# Patient Record
Sex: Male | Born: 1960 | Race: White | Hispanic: No | Smoking: Current every day smoker
Health system: Southern US, Community
[De-identification: ages and names within clinical notes are randomized; demographics above are authoritative.]

## PROBLEM LIST (undated history)

## (undated) DIAGNOSIS — M5442 Lumbago with sciatica, left side: Secondary | ICD-10-CM

## (undated) DIAGNOSIS — I10 Essential (primary) hypertension: Secondary | ICD-10-CM

## (undated) DIAGNOSIS — E782 Mixed hyperlipidemia: Secondary | ICD-10-CM

## (undated) DIAGNOSIS — B1920 Unspecified viral hepatitis C without hepatic coma: Secondary | ICD-10-CM

## (undated) DIAGNOSIS — J449 Chronic obstructive pulmonary disease, unspecified: Secondary | ICD-10-CM

## (undated) DIAGNOSIS — G8929 Other chronic pain: Secondary | ICD-10-CM

## (undated) HISTORY — DX: Essential (primary) hypertension: I10

## (undated) HISTORY — DX: Mixed hyperlipidemia: E78.2

## (undated) HISTORY — DX: Lumbago with sciatica, left side: M54.42

## (undated) HISTORY — DX: Chronic obstructive pulmonary disease, unspecified: J44.9

## (undated) HISTORY — PX: OTHER SURGICAL HISTORY: SHX169

## (undated) HISTORY — DX: Unspecified viral hepatitis C without hepatic coma: B19.20

## (undated) HISTORY — DX: Other chronic pain: G89.29

---

## 2002-12-31 ENCOUNTER — Emergency Department (HOSPITAL_COMMUNITY): Admission: EM | Admit: 2002-12-31 | Discharge: 2002-12-31 | Payer: Self-pay | Admitting: Emergency Medicine

## 2008-08-29 ENCOUNTER — Observation Stay (HOSPITAL_COMMUNITY): Admission: EM | Admit: 2008-08-29 | Discharge: 2008-08-30 | Payer: Self-pay | Admitting: Emergency Medicine

## 2008-11-11 ENCOUNTER — Emergency Department (HOSPITAL_COMMUNITY): Admission: EM | Admit: 2008-11-11 | Discharge: 2008-11-11 | Payer: Self-pay | Admitting: Emergency Medicine

## 2008-11-14 ENCOUNTER — Emergency Department (HOSPITAL_COMMUNITY): Admission: EM | Admit: 2008-11-14 | Discharge: 2008-11-14 | Payer: Self-pay | Admitting: Emergency Medicine

## 2008-11-17 ENCOUNTER — Emergency Department (HOSPITAL_COMMUNITY): Admission: EM | Admit: 2008-11-17 | Discharge: 2008-11-17 | Payer: Self-pay | Admitting: Emergency Medicine

## 2009-04-06 ENCOUNTER — Emergency Department (HOSPITAL_COMMUNITY): Admission: EM | Admit: 2009-04-06 | Discharge: 2009-04-06 | Payer: Self-pay | Admitting: Emergency Medicine

## 2009-08-10 ENCOUNTER — Emergency Department (HOSPITAL_COMMUNITY): Admission: EM | Admit: 2009-08-10 | Discharge: 2009-08-10 | Payer: Self-pay | Admitting: Emergency Medicine

## 2009-09-26 ENCOUNTER — Emergency Department (HOSPITAL_COMMUNITY): Admission: EM | Admit: 2009-09-26 | Discharge: 2009-09-26 | Payer: Self-pay | Admitting: Emergency Medicine

## 2009-10-25 ENCOUNTER — Emergency Department (HOSPITAL_COMMUNITY): Admission: EM | Admit: 2009-10-25 | Discharge: 2009-10-25 | Payer: Self-pay | Admitting: Emergency Medicine

## 2009-11-24 ENCOUNTER — Ambulatory Visit: Payer: Self-pay | Admitting: Family Medicine

## 2009-11-24 ENCOUNTER — Ambulatory Visit (HOSPITAL_COMMUNITY): Admission: RE | Admit: 2009-11-24 | Discharge: 2009-11-24 | Payer: Self-pay | Admitting: Family Medicine

## 2009-11-24 DIAGNOSIS — M129 Arthropathy, unspecified: Secondary | ICD-10-CM | POA: Insufficient documentation

## 2009-11-29 LAB — CONVERTED CEMR LAB
ALT: 35 units/L (ref 0–53)
AST: 33 units/L (ref 0–37)
CO2: 25 meq/L (ref 19–32)
Calcium: 9.4 mg/dL (ref 8.4–10.5)
Glucose, Bld: 91 mg/dL (ref 70–99)
HDL: 51 mg/dL (ref >39)
Potassium: 4.7 meq/L (ref 3.5–5.3)
Sodium: 141 meq/L (ref 135–145)
Triglycerides: 120 mg/dL (ref <150)
Uric Acid, Serum: 4.6 mg/dL (ref 4.0–7.8)

## 2009-12-18 ENCOUNTER — Encounter (INDEPENDENT_AMBULATORY_CARE_PROVIDER_SITE_OTHER): Payer: Self-pay | Admitting: *Deleted

## 2009-12-18 DIAGNOSIS — F17200 Nicotine dependence, unspecified, uncomplicated: Secondary | ICD-10-CM

## 2010-01-22 ENCOUNTER — Telehealth: Payer: Self-pay | Admitting: *Deleted

## 2010-01-22 ENCOUNTER — Telehealth: Payer: Self-pay | Admitting: Family Medicine

## 2010-01-24 ENCOUNTER — Ambulatory Visit: Payer: Self-pay | Admitting: Family Medicine

## 2010-01-24 ENCOUNTER — Encounter: Payer: Self-pay | Admitting: Family Medicine

## 2010-01-24 DIAGNOSIS — M25569 Pain in unspecified knee: Secondary | ICD-10-CM

## 2010-02-08 ENCOUNTER — Encounter: Payer: Self-pay | Admitting: Family Medicine

## 2011-01-17 NOTE — Progress Notes (Signed)
Summary: test results  Phone Note Call from Patient Call back at [phone]   Caller: Patient Summary of Call: pt is wanting test results Initial call taken by: De Nurse,  January 22, 2010 2:32 PM  Follow-up for Phone Call        told him all were normal Follow-up by: Golden Circle RN,  January 22, 2010 2:34 PM

## 2011-01-17 NOTE — Progress Notes (Signed)
Summary: triage  Phone Note Call from Patient Call back at [phone]   Caller: Patient Summary of Call: Pt wondering what his x-ray showed and also wanted to let Dr. know that he is still unable to walk on knee.  Wondering if he has gout? Initial call taken by: Clydell Hakim,  January 22, 2010 2:42 PM  Follow-up for Phone Call        told him x ray was normal. told him I will forward this knee problem complait to pcp for action Follow-up by: Golden Circle RN,  January 22, 2010 2:58 PM  Additional Follow-up for Phone Call Additional follow up Details #1::        gout is unlikely due to low serum uric acid.  The diagnosis is a bit of a puzzle.  He should make a follow up appointment with me.  Additional Follow-up by: Doralee Albino MD,  January 23, 2010 9:20 AM    Additional Follow-up for Phone Call Additional follow up Details #2::    he wanted to be seen right away appt with Dr. Janalyn Harder tomorrow in pm Follow-up by: Golden Circle RN,  January 23, 2010 2:31 PM  Additional Follow-up for Phone Call Additional follow up Details #3:: Details for Additional Follow-up Action Taken: Noted and agree Additional Follow-up by: Doralee Albino MD,  January 23, 2010 4:13 PM

## 2011-01-17 NOTE — Assessment & Plan Note (Signed)
Summary: pain-see notes/Frohna/Hensel   Vital Signs:  Patient profile:   50 year old male Weight:      192.4 pounds Temp:     98.8 degrees F oral Pulse rate:   105 / minute Pulse rhythm:   regular BP sitting:   137 / 95  (right arm) Cuff size:   regular  Vitals Entered By: Loralee Pacas CMA (January 24, 2010 3:50 PM)  Primary Care Provider:  Doralee Albino MD  CC:  R knee pain.  History of Present Illness: cc: right knee pain  50 y/o M with Right knee pain x 1 year.  Pt feels that his knee has "popped out of the joint" although he states that the knee has not done that.  Was told at [facility] that Xray showed that "cartilage is thin".  Taking glucosamine chondritin for one week now, but has not helped.  He has tried Tylenol, Ibuprofen, Naproxen, Oxycodone/APAP 5/325, tramadol without much relief.    Pt works as Scientist, water quality.  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     Tobacco user: smokes 1 ppd  Current Medications (verified): 1)  None  Allergies (verified): 1)  ! Penicillin  Review of Systems MS:  Complains of joint pain; denies joint redness, joint swelling, low back pain, mid back pain, muscle aches, stiffness, and thoracic pain.   Knee Exam  General:    Well-developed, well-nourished, normal body habitus; no deformities, normal grooming.  Gait:    limping gait, favoring right  Knee Exam:    Right:    Inspection:  Normal    Palpation:  Normal    Stability:  stable    Tenderness:  no    Swelling:  no    Erythema:  no    Range of Motion:       Flexion-Active: 85 degrees       Extension-Active: full       Flexion-Passive: 60 degrees       Extension-Passive: full    Pt swelling.  Pivot test able, but painful.  Pain on flexion.  NO crepitus.  NO tenderness on joint line.   Impression & Recommendations:  Problem # 1:  KNEE PAIN, RIGHT, CHRONIC (ICD-719.46) Assessment Unchanged Precepted with Dr Sheffield Slider.  Exam does not show why pt is having  such pain that is refractory to different meds.  Pt would like referral to Orthopedic surgeon.  I told him that I can make the referral but it may take a long time for him to get the appt.  I told him that we needed to relieve some of his pain so that he can work but that complete relief of pain may not be possible.  I offered to give pt scheduled Tylenol or Ibuprofen but he refused, stating that they do not help.  I offerred Mobic but he also refused that.  He then asked for Percocet that as given to him by [facility].  I told pt that I did not feel that percocet was a good longterm solution.  He was very upset with this.  I offered to refill Tramadol but the stated that it Tramadol cost him $20 and it did not work.  Pt stated that the did not want to spend money on medication that did not work.  I offered [facility] [place] since pt has [facility], he became more irritated, stating that it costs him  money to drive to [place] from where he lives just to pick up meds.  Pt stated that he should not have wasted his time and that he should have waited to see Dr Leveda Anna.   The following medications were removed from the medication list:    Tramadol Hcl 50 Mg Tabs (Tramadol hcl) ..... One by mouth q6h as needed pain  Orders: [facility]- Est Level  3 (16109) Orthopedic Referral (Ortho)  Problem # 2:  TOBACCO USER (ICD-305.1) Assessment: Unchanged Pt not interested in quitting.  We discussed the amt of money he is spending on cigarettes.  He volunteered that it costs him $4 per pack, that is almost $1500 a year.  Pt still not interested in quitting.

## 2011-01-17 NOTE — Miscellaneous (Signed)
Summary: Re: orthopedic referral  Clinical Lists Changes   Patient has  been waiting for Jaynee Eagles to send form to him to fill out to see if he qualifies for orange card certification. RN called patient today to follow up since I have not heard  from patient. He called Jaynee Eagles today  and she told him that there are no orthopedist that volunteer with [facility] at this time. patient states he is unable to pay out of pocket. will notifiy MD. Theresia Lo RN  February 08, 2010 2:40 PM  Will try referral to [facility] to see what ideas they have. Doralee Albino MD  February 09, 2010 8:38 AM    appointment scheduled  February 22, 2010  with [facility]. Dr. Donalee Citrin will see at 1:30. patient notified .advised him to call [facility] and make sure he will be able to get the orange card certification. and if he doesn't get it by appointment date will it be retroactive. he will contact her .

## 2011-01-17 NOTE — Letter (Signed)
Summary: *Referral Letter: Orthopedic  [facility]  [address]   [place], [place] [address]   Phone: [phone]  Fax: [phone]    01/24/2010  Thank you in advance for agreeing to see my patient:  Dustin Whitaker [address] [place], [place]  [address]  Phone: [phone]  Reason for Referral:  Mr. Arredondo is a 50 year-old gentleman with Right knee pain for more than one year.  He denies any trauma to the right knee.  His x-rays have been negative.  Lab for uric acid level was within normal limits.  He has tried and failed acetaminophen, ibuprofen, naproxen, and ultram.  He has had relief with Percocet.      Current Medical Problems: 1)  KNEE PAIN, RIGHT, CHRONIC (ICD-719.46) 2)  TOBACCO USER (ICD-305.1) 3)  ARTHRITIS (ICD-716.90)   Current Medications: None  Past Medical History: 1)  Hospitalized at [facility] 2009 for staph infection   Pertinent Labs:    Thank you again for agreeing to see our patient; please contact us if you have any further questions or need additional information.  Sincerely,  Burrel Legrand MD

## 2011-01-17 NOTE — Miscellaneous (Signed)
Summary: [name]  Clinical Lists Changes  Problems: Added new problem of TOBACCO Dustin Whitaker (ICD-305.1) 

## 2011-04-30 NOTE — Consult Note (Signed)
NAMESHERLOCK, NANCARROW                ACCOUNT NO.:  [id number]   MEDICAL RECORD NO.:  [id number]          PATIENT TYPE:  INP   LOCATION:  A317                          FACILITY:  [facility]   PHYSICIAN:  Barbaraann Barthel, M.D. DATE OF BIRTH:  Jan 09, 1961   DATE OF CONSULTATION:  DATE OF DISCHARGE:                                 CONSULTATION   Note, Surgery was asked to see this 50 year old white male for right  groin abscess.   HISTORY OF PRESENT MEDICAL ILLNESS:  The patient states that he had an  abscess and it began with itching in his right groin approximately a  week ago.  He does not remember any spider bite or any other  abnormalities.  He is a nondiabetic.  However, this progressed and he  sought attention at [facility] where he was treated with Bactrim and  released and then he came to the emergency room at [facility]  later on with an obvious abscess and fluctuance and with a history that  he had had some drainage from this area yesterday.  The emergency room  physician called me after he had performed a sonogram on this area.  Clinically, the patient had an abscess that was located in his right  thigh, there are 2 areas where the skin is breeched.  The rectum and the  genitalia is not involved and as stated this patient is a nondiabetic.  He has not eaten this morning.   Physical examination as follows, he is 5 foot 11 inches, weighs 190  pounds, his blood pressure is 145/83, his pulse rate is 92, his  respirations are 20.  Head is normocephalic.  Eyes, extraocular  movements are intact.  Pupils are equal and reactive to light and  accommodation.  The patient has very poor dentition.  He has no cervical  adenopathy and no bruits are appreciated.   Chest is clear.  Heart regular rhythm.  Abdomen is soft; there are no  hernias.  As stated, the genitalia is not involved in this process.  Rectal examination, the prostate is smooth and stool is guaiac-negative.  Extremities,  apart from tattoos are grossly within normal limits other  than the involved right thigh.  He says he had a similar episode that  occurred, then was treated with antibiotics in his left ankle area years  ago.   REVIEW OF SYSTEMS:  GI System, no change in bowel habits.  No nausea or  vomiting, and no bright red rectal bleeding or black tarry stools.  The  patient has had no colonoscopy.  No history of hepatitis or inflammatory  bowel disease.   Endocrine System, no history of diabetes or thyroid disease.   GU System, no dysuria or history of kidney stones.   Cardiovascular System, the patient smokes at least 2 packs of cigarettes  per day.  He use to have a drinking problem, but he has not many years.  He is to have a social beer every now and then.  Musculoskeletal System  grossly within normal limits.   Socioeconomic, the patient works remodeling homes.  MEDICATIONS:  He takes none on regular basis.  He is allergic to  PENICILLIN.   IMPRESSION:  Therefore, abscess and cellulitis of right thigh.   PLAN:  The patient was seen in the emergency room.  Vancomycin was  initiated by the ER physician.  I agree with this.  We will plan for an  I&D and debridement in the operating room today as soon as possible.  He  is to maintain n.p.o. status until then.  I will admit him likely to  observation status and if we need to get a PICC line in him today, we  will do so and plan for care as treatment as an outpatient with  antibiotics as needed.      Barbaraann Barthel, M.D.  Electronically Signed     WB/MEDQ  D:  08/29/2008  T:  08/30/2008  Job:  [id number]   cc:   Dr. Hyacinth Meeker

## 2011-04-30 NOTE — Op Note (Signed)
NAMECATARINO, Dustin Whitaker                ACCOUNT NO.:  [id number]   MEDICAL RECORD NO.:  [id number]          PATIENT TYPE:  INP   LOCATION:  A317                          FACILITY:  [facility]   PHYSICIAN:  Barbaraann Barthel, M.D. DATE OF BIRTH:  Jul 09, 1961   DATE OF PROCEDURE:  08/29/2008  DATE OF DISCHARGE:                               OPERATIVE REPORT   PREOPERATIVE DIAGNOSIS:  Right groin abscess.   POSTOPERATIVE DIAGNOSIS:  Right groin abscess.   PROCEDURE:  Incision and drainage and debridement of right groin.   SURGEON:  Barbaraann Barthel, MD.   SPECIMEN:  Cultures were sent.   WOUND CLASSIFICATION:  Infected.   Note, this is a 50 year old white male nondiabetic who presented with a  right groin abscess with a history of 1 week's worth of cellulitis and  inflammation of his right thigh.  He was seen initially and treated with  p.o. antibiotics in [place].  He came to our emergency room where he  was admitted for drainage of his abscess and definitive care.   GROSS OPERATIVE FINDINGS:  The patient had grayish pus in his right  groin down to his fascia.  Cultures were sent from this and the wound  was debrided.  The detritus and debridement was not sent to Pathology;  however, we did send the cultures.  The patient had been started on  vancomycin in the emergency room.   TECHNIQUE:  The patient was placed in supine position with his legs frog-  legged and after spinal anesthesia, his wound was then prepped with  Betadine solution and draped in usual manner and I made a transverse  incision across the area of drainage removing the necrotic skin and  necrotic subcutaneous and fascia.  We debrided the skin, subcutaneous  tissue, and fascia.  We irrigated this with normal saline solution and  after curetting and checking for hemostasis and then I packed this up  with a 2-inch saline-soaked roll gauze.  The sterile dressing with 4x4s  and ABD pad was applied.  Prior to closure, all  sponge, needle, and  instrument counts were found to be correct.  Estimated blood loss was  minimal.  Cultures were sent, and there were no complications.  The  patient received 800 mL of crystalloids intraoperatively.  The patient  was taken to recovery room in satisfactory condition.   We will obtain a PICC line so that he can have outpatient vancomycin  therapy and we will also consult the Physical Therapy Department for  daily wound care, which will consist of irrigations and packing with  roll gauze.      Barbaraann Barthel, M.D.  Electronically Signed     WB/MEDQ  D:  08/29/2008  T:  08/30/2008  Job:  [id number]   cc:   Eber Hong, MD   Physical Therapy Department

## 2011-09-16 LAB — BASIC METABOLIC PANEL
BUN: 5 — ABNORMAL LOW
Chloride: 106
GFR calc non Af Amer: >60
Glucose, Bld: 106 — ABNORMAL HIGH
Potassium: 4.2

## 2011-09-16 LAB — DIFFERENTIAL
Basophils Relative: 1
Lymphocytes Relative: 14
Lymphs Abs: 1.6
Neutro Abs: 7.9 — ABNORMAL HIGH

## 2011-09-16 LAB — CBC
Hemoglobin: 12.5 — ABNORMAL LOW
Platelets: 349
RBC: 3.73 — ABNORMAL LOW
WBC: 11 — ABNORMAL HIGH

## 2011-09-17 LAB — CULTURE, ROUTINE-ABSCESS

## 2011-09-18 LAB — URINE MICROSCOPIC-ADD ON

## 2011-09-18 LAB — DIFFERENTIAL
Basophils Relative: 0
Eosinophils Relative: 3
Lymphocytes Relative: 10 — ABNORMAL LOW
Neutrophils Relative %: 77

## 2011-09-18 LAB — BASIC METABOLIC PANEL
BUN: 7
Chloride: 100
Creatinine, Ser: 1.03
GFR calc Af Amer: >60
GFR calc non Af Amer: >60
Glucose, Bld: 96
Sodium: 134 — ABNORMAL LOW

## 2011-09-18 LAB — URINALYSIS, ROUTINE W REFLEX MICROSCOPIC
Glucose, UA: NEGATIVE
Nitrite: NEGATIVE
Protein, ur: NEGATIVE
pH: 5.5

## 2011-09-18 LAB — CULTURE, ROUTINE-ABSCESS

## 2011-09-18 LAB — CULTURE, BLOOD (ROUTINE X 2): Report Status: 9192009

## 2011-09-18 LAB — CBC
HCT: 39.7
Hemoglobin: 13.9
RDW: 12.5

## 2011-09-18 LAB — ANAEROBIC CULTURE

## 2011-11-10 ENCOUNTER — Emergency Department (HOSPITAL_COMMUNITY)
Admission: EM | Admit: 2011-11-10 | Discharge: 2011-11-11 | Discharge status: Against Medical Advice | Payer: Self-pay | Attending: Emergency Medicine | Admitting: Emergency Medicine

## 2011-11-10 ENCOUNTER — Emergency Department (HOSPITAL_COMMUNITY): Payer: Self-pay

## 2011-11-10 DIAGNOSIS — M25559 Pain in unspecified hip: Secondary | ICD-10-CM | POA: Insufficient documentation

## 2011-11-10 DIAGNOSIS — M25519 Pain in unspecified shoulder: Secondary | ICD-10-CM | POA: Insufficient documentation

## 2011-11-10 DIAGNOSIS — W010XXA Fall on same level from slipping, tripping and stumbling without subsequent striking against object, initial encounter: Secondary | ICD-10-CM | POA: Insufficient documentation

## 2011-11-10 NOTE — ED Notes (Signed)
Pt reports slipping and falling on left shoulder and hip.  Reports greatest pain in shoulder.  No deformity noted.  ROM decreased due to pain.  Denies numbness, or tingling.  Pt reports very minimal pain in left hip.  No difficulty with ambulation.

## 2011-11-10 NOTE — ED Notes (Signed)
Pt slipped on water and fell on left shoulder and left hip.

## 2011-11-11 NOTE — ED Notes (Signed)
Pt stated that he is tired of waiting on physician and wishes to leave.  Explained to pt that physician will assess him as soon as possible.  Pt demanding to leave.  AMA form signed.

## 2012-01-16 ENCOUNTER — Encounter (HOSPITAL_COMMUNITY): Payer: Self-pay | Admitting: Emergency Medicine

## 2012-01-16 ENCOUNTER — Emergency Department (HOSPITAL_COMMUNITY)
Admission: EM | Admit: 2012-01-16 | Discharge: 2012-01-16 | Disposition: A | Discharge status: Home / Self Care | Payer: Self-pay | Attending: Emergency Medicine | Admitting: Emergency Medicine

## 2012-01-16 ENCOUNTER — Emergency Department (HOSPITAL_COMMUNITY): Payer: Self-pay

## 2012-01-16 DIAGNOSIS — M25512 Pain in left shoulder: Secondary | ICD-10-CM

## 2012-01-16 DIAGNOSIS — M25569 Pain in unspecified knee: Secondary | ICD-10-CM | POA: Insufficient documentation

## 2012-01-16 DIAGNOSIS — M25519 Pain in unspecified shoulder: Secondary | ICD-10-CM | POA: Insufficient documentation

## 2012-01-16 MED ORDER — PREDNISONE 10 MG PO TABS: ORAL_TABLET | ORAL | Status: DC

## 2012-01-16 MED ORDER — HYDROCODONE-ACETAMINOPHEN 5-325 MG PO TABS: ORAL_TABLET | ORAL | Status: AC
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
## 2012-01-18 NOTE — ED Provider Notes (Signed)
Medical screening examination/treatment/procedure(s) were performed by non-physician practitioner and as supervising physician I was immediately available for consultation/collaboration.  Flint Melter, MD 01/18/12 [phone]

## 2014-06-14 ENCOUNTER — Encounter (INDEPENDENT_AMBULATORY_CARE_PROVIDER_SITE_OTHER): Payer: Self-pay | Admitting: *Deleted

## 2014-07-13 ENCOUNTER — Encounter (INDEPENDENT_AMBULATORY_CARE_PROVIDER_SITE_OTHER): Payer: Self-pay | Admitting: Internal Medicine

## 2014-07-13 ENCOUNTER — Encounter (INDEPENDENT_AMBULATORY_CARE_PROVIDER_SITE_OTHER): Payer: Self-pay | Admitting: *Deleted

## 2014-07-13 ENCOUNTER — Ambulatory Visit (INDEPENDENT_AMBULATORY_CARE_PROVIDER_SITE_OTHER): Payer: BC Managed Care – PPO | Admitting: Internal Medicine

## 2014-07-13 VITALS — BP 132/70 | HR 84 | Temp 97.7°F | Ht 70.0 in | Wt 187.4 lb

## 2014-07-13 DIAGNOSIS — B1920 Unspecified viral hepatitis C without hepatic coma: Secondary | ICD-10-CM | POA: Insufficient documentation

## 2014-07-13 DIAGNOSIS — B182 Chronic viral hepatitis C: Secondary | ICD-10-CM

## 2014-07-13 LAB — PROTIME-INR
INR: 0.95 (ref <1.50)
PROTHROMBIN TIME: 12.7 s (ref 11.6–15.2)

## 2014-07-13 NOTE — Patient Instructions (Signed)
Labs, Further recommendations to follow.

## 2014-07-13 NOTE — Progress Notes (Addendum)
Subjective:     Patient ID: Dustin Whitaker, male   DOB: 08/30/1961, 53 y.o.   MRN: [id number]  HPIReferred to our office by Dr. Sherryll BurgerShah for Hepatitis C.. [facility] [place] [facility].  Hep A and B negative.  Had a girlfriend with Hepatitis C. Hx of IV drugs years ago (10 yrs ago) Hx of heavy drinking and stopped 4 months ago.Drank 12 pack a day.  10 tattoos. No hx of jaundice.  Self employed as a Scientist, water qualitybrick mason.  Appetite is okay. Sometimes he has weakness. No weight loss. Usually has a BM every other day. Sometimes his stools are yellow. No abdominal pain.   06/19/2014 Hep C Virus Ab greater than 11.   01/19/2014 ALP 80, AST 17, ALT 12, H and H 15.7 and 46.8, Platelet ct 408.   Review of Systems Past Medical History  Diagnosis Date  . Hepatitis C     Past Surgical History  Procedure Laterality Date  . Staph infection      groin 7-8 yrs ago Dr. Malvin JohnsBradford.     Allergies  Allergen Reactions  . Penicillins     Childhood Reaction     Current Outpatient Prescriptions on File Prior to Visit  Medication Sig Dispense Refill  . ibuprofen (ADVIL,MOTRIN) 200 MG tablet Take 600 mg by mouth every 6 (six) hours as needed. Pain      . predniSONE (DELTASONE) 10 MG tablet Take 6 tablets day one, 5 tablets day two, 4 tablets day three, 3 tablets day four, 2 tablets day five, then 1 tablet day six  21 tablet  0   No current facility-administered medications on file prior to visit.        Objective:   Physical Exam  Filed Vitals:   07/13/14 1112  BP: 132/70  Pulse: 84  Temp: 97.7 F (36.5 C)  Height: 5\' 10"  (1.778 m)  Weight: 187 lb 6.4 oz (85.004 kg)  Alert and oriented. Skin warm and dry. Oral mucosa is moist.   . Sclera anicteric, conjunctivae is pink. Thyroid not enlarged. No cervical lymphadenopathy. Lungs clear. Heart regular rate and rhythm.  Abdomen is soft. Bowel sounds are positive. No hepatomegaly. No abdominal masses felt. No  tenderness.  No edema to lower extremities.       Assessment:    Hepatitis C with risk factors. Past hx of IV drug use and tattoos.   No etoh in 4 months.     Plan:     PT/INR, CBC, Cmet, Hep C Quaint. OV pending. US abdomen with elastrography. Needs Hepatitis A and B vaccine. Rx given. Further recommendations to follow.

## 2014-07-15 LAB — HEPATITIS C RNA QUANTITATIVE: HCV Quantitative: NOT DETECTED IU/mL (ref <15)

## 2014-07-18 ENCOUNTER — Ambulatory Visit (HOSPITAL_COMMUNITY)
Admission: RE | Admit: 2014-07-18 | Discharge: 2014-07-18 | Disposition: A | Discharge status: Home / Self Care | Payer: BC Managed Care – PPO | Source: Ambulatory Visit | Attending: Internal Medicine | Admitting: Internal Medicine

## 2014-07-18 DIAGNOSIS — B182 Chronic viral hepatitis C: Secondary | ICD-10-CM | POA: Insufficient documentation

## 2014-08-05 ENCOUNTER — Encounter (INDEPENDENT_AMBULATORY_CARE_PROVIDER_SITE_OTHER): Payer: Self-pay

## 2015-01-25 ENCOUNTER — Ambulatory Visit (INDEPENDENT_AMBULATORY_CARE_PROVIDER_SITE_OTHER): Payer: BC Managed Care – PPO | Admitting: Internal Medicine

## 2015-03-07 ENCOUNTER — Telehealth (INDEPENDENT_AMBULATORY_CARE_PROVIDER_SITE_OTHER): Payer: Self-pay | Admitting: *Deleted

## 2015-03-07 ENCOUNTER — Encounter (INDEPENDENT_AMBULATORY_CARE_PROVIDER_SITE_OTHER): Payer: Self-pay | Admitting: *Deleted

## 2015-03-07 NOTE — Telephone Encounter (Signed)
Dustin LevelMichael NO SHOWED for his apt with Dustin Arerri Setzer, NP on 01/25/15. A NS letter has been mailed.

## 2015-09-30 ENCOUNTER — Emergency Department (HOSPITAL_COMMUNITY): Payer: Self-pay

## 2015-09-30 ENCOUNTER — Encounter (HOSPITAL_COMMUNITY): Payer: Self-pay | Admitting: Emergency Medicine

## 2015-09-30 ENCOUNTER — Emergency Department (HOSPITAL_COMMUNITY)
Admission: EM | Admit: 2015-09-30 | Discharge: 2015-09-30 | Disposition: A | Discharge status: Home / Self Care | Payer: Self-pay | Attending: Emergency Medicine | Admitting: Emergency Medicine

## 2015-09-30 DIAGNOSIS — Y9389 Activity, other specified: Secondary | ICD-10-CM | POA: Insufficient documentation

## 2015-09-30 DIAGNOSIS — F1310 Sedative, hypnotic or anxiolytic abuse, uncomplicated: Secondary | ICD-10-CM | POA: Insufficient documentation

## 2015-09-30 DIAGNOSIS — Z720 Tobacco use: Secondary | ICD-10-CM | POA: Insufficient documentation

## 2015-09-30 DIAGNOSIS — Z880 Allergy status to penicillin: Secondary | ICD-10-CM | POA: Insufficient documentation

## 2015-09-30 DIAGNOSIS — S20211A Contusion of right front wall of thorax, initial encounter: Secondary | ICD-10-CM | POA: Insufficient documentation

## 2015-09-30 DIAGNOSIS — Y92410 Unspecified street and highway as the place of occurrence of the external cause: Secondary | ICD-10-CM | POA: Insufficient documentation

## 2015-09-30 DIAGNOSIS — Z8619 Personal history of other infectious and parasitic diseases: Secondary | ICD-10-CM | POA: Insufficient documentation

## 2015-09-30 DIAGNOSIS — Y998 Other external cause status: Secondary | ICD-10-CM | POA: Insufficient documentation

## 2015-09-30 DIAGNOSIS — S42001A Fracture of unspecified part of right clavicle, initial encounter for closed fracture: Secondary | ICD-10-CM

## 2015-09-30 DIAGNOSIS — S1083XA Contusion of other specified part of neck, initial encounter: Secondary | ICD-10-CM | POA: Insufficient documentation

## 2015-09-30 DIAGNOSIS — R000 Tachycardia, unspecified: Secondary | ICD-10-CM | POA: Insufficient documentation

## 2015-09-30 DIAGNOSIS — S1093XA Contusion of unspecified part of neck, initial encounter: Secondary | ICD-10-CM

## 2015-09-30 DIAGNOSIS — S42031A Displaced fracture of lateral end of right clavicle, initial encounter for closed fracture: Secondary | ICD-10-CM | POA: Insufficient documentation

## 2015-09-30 LAB — I-STAT CHEM 8, ED
BUN: 11 mg/dL (ref 6–20)
CALCIUM ION: 1.13 mmol/L (ref 1.12–1.23)
CHLORIDE: 102 mmol/L (ref 101–111)
Creatinine, Ser: 0.8 mg/dL (ref 0.61–1.24)
GLUCOSE: 100 mg/dL — AB (ref 65–99)
HCT: 41 % (ref 39.0–52.0)
HEMOGLOBIN: 13.9 g/dL (ref 13.0–17.0)
Potassium: 3.9 mmol/L (ref 3.5–5.1)
Sodium: 138 mmol/L (ref 135–145)
TCO2: 21 mmol/L (ref 0–100)

## 2015-09-30 LAB — COMPREHENSIVE METABOLIC PANEL
ALBUMIN: 4.1 g/dL (ref 3.5–5.0)
ALT: 16 U/L — AB (ref 17–63)
AST: 29 U/L (ref 15–41)
Alkaline Phosphatase: 65 U/L (ref 38–126)
Anion gap: 9 (ref 5–15)
BILIRUBIN TOTAL: 0.6 mg/dL (ref 0.3–1.2)
BUN: 13 mg/dL (ref 6–20)
CHLORIDE: 104 mmol/L (ref 101–111)
CO2: 24 mmol/L (ref 22–32)
Calcium: 8.7 mg/dL — ABNORMAL LOW (ref 8.9–10.3)
Creatinine, Ser: 0.88 mg/dL (ref 0.61–1.24)
GFR calc Af Amer: >60 mL/min (ref >60)
GFR calc non Af Amer: >60 mL/min (ref >60)
GLUCOSE: 101 mg/dL — AB (ref 65–99)
POTASSIUM: 3.9 mmol/L (ref 3.5–5.1)
Sodium: 137 mmol/L (ref 135–145)
Total Protein: 7.4 g/dL (ref 6.5–8.1)

## 2015-09-30 LAB — RAPID URINE DRUG SCREEN, HOSP PERFORMED
AMPHETAMINES: NOT DETECTED
Barbiturates: NOT DETECTED
Benzodiazepines: POSITIVE — AB
Cocaine: NOT DETECTED
Opiates: NOT DETECTED
Tetrahydrocannabinol: NOT DETECTED

## 2015-09-30 LAB — CBC WITH DIFFERENTIAL/PLATELET
Basophils Absolute: 0 10*3/uL (ref 0.0–0.1)
Basophils Relative: 0 %
EOS PCT: 3 %
Eosinophils Absolute: 0.3 10*3/uL (ref 0.0–0.7)
HCT: 39.3 % (ref 39.0–52.0)
Hemoglobin: 13.3 g/dL (ref 13.0–17.0)
LYMPHS ABS: 1.3 10*3/uL (ref 0.7–4.0)
LYMPHS PCT: 14 %
MCH: 31.8 pg (ref 26.0–34.0)
MCHC: 33.8 g/dL (ref 30.0–36.0)
MCV: 94 fL (ref 78.0–100.0)
MONO ABS: 0.7 10*3/uL (ref 0.1–1.0)
Monocytes Relative: 8 %
Neutro Abs: 7.3 10*3/uL (ref 1.7–7.7)
Neutrophils Relative %: 75 %
PLATELETS: 268 10*3/uL (ref 150–400)
RBC: 4.18 MIL/uL — ABNORMAL LOW (ref 4.22–5.81)
RDW: 12.8 % (ref 11.5–15.5)
WBC: 9.7 10*3/uL (ref 4.0–10.5)

## 2015-09-30 LAB — PROTIME-INR
INR: 1.04 (ref 0.00–1.49)
PROTHROMBIN TIME: 13.8 s (ref 11.6–15.2)

## 2015-09-30 LAB — URINALYSIS, ROUTINE W REFLEX MICROSCOPIC
Bilirubin Urine: NEGATIVE
Glucose, UA: NEGATIVE mg/dL
HGB URINE DIPSTICK: NEGATIVE
Ketones, ur: NEGATIVE mg/dL
LEUKOCYTES UA: NEGATIVE
Nitrite: NEGATIVE
PROTEIN: NEGATIVE mg/dL
UROBILINOGEN UA: 0.2 mg/dL (ref 0.0–1.0)
pH: 5 (ref 5.0–8.0)

## 2015-09-30 LAB — ETHANOL

## 2015-09-30 MED ORDER — IOHEXOL 350 MG/ML SOLN
125.0000 mL | Freq: Once | INTRAVENOUS | Status: AC | PRN
  Administered 2015-09-30: 150 mL via INTRAVENOUS

## 2015-09-30 MED ORDER — SODIUM CHLORIDE 0.9 % IV BOLUS (SEPSIS)
500.0000 mL | Freq: Once | INTRAVENOUS | Status: AC
  Administered 2015-09-30: 500 mL via INTRAVENOUS

## 2015-09-30 MED ORDER — TRAMADOL HCL 50 MG PO TABS: 50.0000 mg | ORAL_TABLET | Freq: Four times a day (QID) | ORAL | Status: DC | PRN

## 2015-09-30 NOTE — ED Notes (Signed)
Small amount of soft tissue bruising noted to right chest wall, tender to touch.  C-collar readjusted and pt repositioned in bed.

## 2015-09-30 NOTE — ED Notes (Signed)
[place] C-collar applied by RN and Denny PeonErin, RN per MD verbal orders.

## 2015-09-30 NOTE — ED Notes (Signed)
Patient transported to CT 
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
## 2015-09-30 NOTE — ED Notes (Signed)
Ambulated Pt his Bp was 120/70 and his pulse was 79 and his spo2 was 91

## 2015-09-30 NOTE — Discharge Instructions (Signed)
Chest Contusion A chest contusion is a deep bruise on your chest area. Contusions are the result of an injury that caused bleeding under the skin. A chest contusion may involve bruising of the skin, muscles, or ribs. The contusion may turn blue, purple, or yellow. Minor injuries will give you a painless contusion, but more severe contusions may stay painful and swollen for a few weeks. CAUSES  A contusion is usually caused by a blow, trauma, or direct force to an area of the body. SYMPTOMS   Swelling and redness of the injured area.  Discoloration of the injured area.  Tenderness and soreness of the injured area.  Pain. DIAGNOSIS  The diagnosis can be made by taking a history and performing a physical exam. An X-ray, CT scan, or MRI may be needed to determine if there were any associated injuries, such as broken bones (fractures) or internal injuries. TREATMENT  Often, the best treatment for a chest contusion is resting, icing, and applying cold compresses to the injured area. Deep breathing exercises may be recommended to reduce the risk of pneumonia. Over-the-counter medicines may also be recommended for pain control. HOME CARE INSTRUCTIONS   Put ice on the injured area.  Put ice in a plastic bag.  Place a towel between your skin and the bag.  Leave the ice on for 15-20 minutes, 03-04 times a day.  Only take over-the-counter or prescription medicines as directed by your caregiver. Your caregiver may recommend avoiding anti-inflammatory medicines (aspirin, ibuprofen, and naproxen) for 48 hours because these medicines may increase bruising.  Rest the injured area.  Perform deep-breathing exercises as directed by your caregiver.  Stop smoking if you smoke.  Do not lift objects over 5 pounds (2.3 kg) for 3 days or longer if recommended by your caregiver. SEEK IMMEDIATE MEDICAL CARE IF:   You have increased bruising or swelling.  You have pain that is getting worse.  You have  difficulty breathing.  You have dizziness, weakness, or fainting.  You have blood in your urine or stool.  You cough up or vomit blood.  Your swelling or pain is not relieved with medicines. MAKE SURE YOU:   Understand these instructions.  Will watch your condition.  Will get help right away if you are not doing well or get worse.   This information is not intended to replace advice given to you by your health care provider. Make sure you discuss any questions you have with your health care provider.   Document Released: 08/27/2001 Document Revised: 08/26/2012 Document Reviewed: 05/25/2012 Elsevier Interactive Patient Education 2016 [employer]  Soft Tissue Injury of the Neck A soft tissue injury of the neck may be either blunt or penetrating. A blunt injury does not break the skin. A penetrating injury breaks the skin, creating an open wound. Blunt injuries may happen in several ways. Most involve some type of direct blow to the neck. This can cause serious injury to the windpipe, voice box, cervical spine, or esophagus. In some cases, the injury to the soft tissue can also result in a break (fracture) of the cervical spine.  Soft tissue injuries of the neck require immediate medical care. Sometimes, you may not notice the signs of injury right away. You may feel fine at first, but the swelling may eventually close off your airway. This could result in a significant or life-threatening injury. This is rare, but it is important to keep in mind with any injury to the neck.  CAUSES  Causes of blunt injury may include:  "Clothesline" injuries. This happens when someone is moving at high speed and runs into a clothesline, outstretched arm, or similar object. This results in a direct injury to the front of the neck. If the airway is blocked, it can cause suffocation due to lack of oxygen (asphyxiation) or even instant death.  High-energy trauma. This includes injuries from motor vehicle  crashes, falling from a great height, or heavy objects falling onto the neck.  Sports-related injuries. Injury to the windpipe and voice box can result from being struck by another player or being struck by an object, such as a baseball, hockey stick, or an outstretched arm.  Strangulation. This type of injury may cause skin trauma, hoarseness of voice, or broken cartilage in the voice box or windpipe. It may also cause a serious airway problem. SYMPTOMS   Bruising.  Pain and tenderness in the neck.  Swelling of the neck and face.  Hoarseness of voice.  Pain or difficulty with swallowing.  Drooling or inability to swallow.  Trouble breathing. This may become worse when lying flat.  Coughing up blood.  High-pitched, harsh, vibratory noise due to partial obstruction of the windpipe (stridor).  Swelling of the upper arms.  Windpipe that appears to be pushed off to one side.  Air in the tissues under the skin of the neck or chest (subcutaneous emphysema). This usually indicates a problem with the normal airway and is a medical emergency. DIAGNOSIS   If possible, your caregiver may ask about the details of how the injury occurred. A detailed exam can help to identify specific areas of the neck that are injured.  Your caregiver may ask for tests to rule out injury of the voice box, airway, or esophagus. This may include X-rays, ultrasounds, CT scans, or MRI scans, depending on the severity of your injury. TREATMENT  If you have an injury to your windpipe or voice box, immediate medical care is required. In almost all cases, hospitalization is necessary. For injuries that do not appear to require surgery, it is helpful to have medical observation for 24 hours. You may be asked to do one or more of the following:  Rest your voice.  Bed rest.  Limit your diet, depending on the extent of the injury. Follow your caregiver's dietary guidelines. Often, only fluids and soft foods are  recommended.  Keep your head raised.  Breathe humidified air.  Take medicines to control infection, reduce swelling, and reduce normal stomach acid. You may also need pain medicine, depending on your injury. For injuries that appear to require surgery, you will need to stay in the hospital. The exact type of procedure needed will depend on your exact injury or injuries.  HOME CARE INSTRUCTIONS   If the skin was broken, keep the wound area clean and dry. Wear your bandage (dressing) and care for your wound as instructed.  Follow your caregiver's advice about your diet.  Follow your caregiver's advice about use of your voice.  Take medicines as directed.  Keep your head and neck at least partially raised (elevated) while recovering. This should also be done while sleeping. SEEK MEDICAL CARE IF:   Your voice becomes weaker.  Your swelling or bruising is not improving as expected. Typically, this takes several days to improve.  You feel that you are having problems with medicines prescribed.  You have drainage from the injury site. This may be a sign that your wound is not healing properly or  is infected.  You develop increasing pain or difficulty while swallowing.  You develop an oral temperature of 102 F (38.9 C) or higher. SEEK IMMEDIATE MEDICAL CARE IF:   You cough up blood.  You develop sudden trouble breathing.  You cannot tolerate your oral medicines, or you are unable to swallow.  You develop drooling.  You have new or worsening vomiting.  You develop sudden, new swelling of the neck or face.  You have an oral temperature above 102 F (38.9 C), not controlled by medicine. MAKE SURE YOU:  Understand these instructions.  Will watch your condition.  Will get help right away if you are not doing well or get worse.   This information is not intended to replace advice given to you by your health care provider. Make sure you discuss any questions you have with  your health care provider.   Document Released: 03/10/2008 Document Revised: 02/24/2012 Document Reviewed: 06/21/2015 Elsevier Interactive Patient Education 2016 [employer]  Clavicle Fracture The clavicle, also called the collarbone, is the long bone that connects your shoulder to your rib cage. You can feel your collarbone at the top of your shoulders and rib cage. A clavicle fracture is a broken clavicle. It is a common injury that can happen at any age.  CAUSES Common causes of a clavicle fracture include:  A direct blow to your shoulder.  A car accident.  A fall, especially if you try to break your fall with an outstretched arm. RISK FACTORS You may be at increased risk if:  You are younger than 25 years or older than 75 years. Most clavicle fractures happen to people who are younger than 25 years.  You are a male.  You play contact sports. SIGNS AND SYMPTOMS A fractured clavicle is painful. It also makes it hard to move your arm. Other signs and symptoms may include:  A shoulder that drops downward and forward.  Pain when trying to lift your shoulder.  Bruising, swelling, and tenderness over your clavicle.  A grinding noise when you try to move your shoulder.  A bump over your clavicle. DIAGNOSIS Your health care provider can usually diagnose a clavicle fracture by asking about your injury and examining your shoulder and clavicle. He or she may take an X-ray to determine the position of your clavicle. TREATMENT Treatment depends on the position of your clavicle after the fracture:  If the broken ends of the bone are not out of place, your health care provider may put your arm in a sling or wrap a support bandage around your chest (figure-of-eight wrap).  If the broken ends of the bone are out of place, you may need surgery. Surgery may involve placing screws, pins, or plates to keep your clavicle stable while it heals. Healing may take about 3 months. When your  health care provider thinks your fracture has healed enough, you may have to do physical therapy to regain normal movement and build up your arm strength. HOME CARE INSTRUCTIONS   Apply ice to the injured area:  Put ice in a plastic bag.  Place a towel between your skin and the bag.  Leave the ice on for 20 minutes, 2-3 times a day.  If you have a wrap or splint:  Wear it all the time, and remove it only to take a bath or shower.  When you bathe or shower, keep your shoulder in the same position as when the sling or wrap is on.  Do not  lift your arm.  If you have a figure-of-eight wrap:  Another person must tighten it every day.  It should be tight enough to hold your shoulders back.  Allow enough room to place your index finger between your body and the strap.  Loosen the wrap immediately if you feel numbness or tingling in your hands.  Only take medicines as directed by your health care provider.  Avoid activities that make the injury or pain worse for 4-6 weeks after surgery.  Keep all follow-up appointments. SEEK MEDICAL CARE IF:  Your medicine is not helping to relieve pain and swelling. SEEK IMMEDIATE MEDICAL CARE IF:  Your arm is numb, cold, or pale, even when the splint is loose. MAKE SURE YOU:   Understand these instructions.  Will watch your condition.  Will get help right away if you are not doing well or get worse.   This information is not intended to replace advice given to you by your health care provider. Make sure you discuss any questions you have with your health care provider.   Document Released: 09/11/2005 Document Revised: 12/07/2013 Document Reviewed: 10/25/2013 Elsevier Interactive Patient Education [employer]
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
## 2015-10-09 ENCOUNTER — Emergency Department (HOSPITAL_COMMUNITY): Payer: Self-pay

## 2015-10-09 ENCOUNTER — Encounter (HOSPITAL_COMMUNITY): Payer: Self-pay | Admitting: Emergency Medicine

## 2015-10-09 ENCOUNTER — Emergency Department (HOSPITAL_COMMUNITY)
Admission: EM | Admit: 2015-10-09 | Discharge: 2015-10-09 | Disposition: A | Discharge status: Home / Self Care | Payer: Self-pay | Attending: Emergency Medicine | Admitting: Emergency Medicine

## 2015-10-09 DIAGNOSIS — Y92410 Unspecified street and highway as the place of occurrence of the external cause: Secondary | ICD-10-CM | POA: Insufficient documentation

## 2015-10-09 DIAGNOSIS — Z8619 Personal history of other infectious and parasitic diseases: Secondary | ICD-10-CM | POA: Insufficient documentation

## 2015-10-09 DIAGNOSIS — R0781 Pleurodynia: Secondary | ICD-10-CM

## 2015-10-09 DIAGNOSIS — Y998 Other external cause status: Secondary | ICD-10-CM | POA: Insufficient documentation

## 2015-10-09 DIAGNOSIS — Z880 Allergy status to penicillin: Secondary | ICD-10-CM | POA: Insufficient documentation

## 2015-10-09 DIAGNOSIS — Z720 Tobacco use: Secondary | ICD-10-CM | POA: Insufficient documentation

## 2015-10-09 DIAGNOSIS — S29001A Unspecified injury of muscle and tendon of front wall of thorax, initial encounter: Secondary | ICD-10-CM | POA: Insufficient documentation

## 2015-10-09 DIAGNOSIS — S199XXA Unspecified injury of neck, initial encounter: Secondary | ICD-10-CM | POA: Insufficient documentation

## 2015-10-09 DIAGNOSIS — R0600 Dyspnea, unspecified: Secondary | ICD-10-CM | POA: Insufficient documentation

## 2015-10-09 DIAGNOSIS — Y9389 Activity, other specified: Secondary | ICD-10-CM | POA: Insufficient documentation

## 2015-10-09 DIAGNOSIS — R05 Cough: Secondary | ICD-10-CM | POA: Insufficient documentation

## 2015-10-09 DIAGNOSIS — M542 Cervicalgia: Secondary | ICD-10-CM

## 2015-10-09 MED ORDER — IBUPROFEN 600 MG PO TABS: 600.0000 mg | ORAL_TABLET | Freq: Three times a day (TID) | ORAL | Status: AC

## 2015-10-09 MED ORDER — HYDROCODONE-ACETAMINOPHEN 5-325 MG PO TABS
1.0000 | ORAL_TABLET | Freq: Once | ORAL | Status: AC
  Administered 2015-10-09: 1 via ORAL

## 2015-10-09 MED ORDER — HYDROCODONE-ACETAMINOPHEN 5-325 MG PO TABS
ORAL_TABLET | ORAL | Status: AC
  Filled 2015-10-09: qty 1

## 2015-10-09 MED ORDER — HYDROCODONE-ACETAMINOPHEN 5-325 MG PO TABS: 1.0000 | ORAL_TABLET | Freq: Four times a day (QID) | ORAL | Status: DC | PRN
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
## 2015-10-09 NOTE — ED Notes (Signed)
Pt is back from x-ray
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
## 2015-10-09 NOTE — Discharge Instructions (Signed)
°  Please follow-up with your primary care physician, and it do not hesitate to return to the emergency department if you develop new, or concerning changes in your condition.

## 2015-10-11 ENCOUNTER — Telehealth (HOSPITAL_BASED_OUTPATIENT_CLINIC_OR_DEPARTMENT_OTHER): Payer: Self-pay | Admitting: Emergency Medicine

## 2017-07-14 ENCOUNTER — Ambulatory Visit (INDEPENDENT_AMBULATORY_CARE_PROVIDER_SITE_OTHER): Payer: Self-pay | Admitting: Otolaryngology

## 2017-08-27 ENCOUNTER — Ambulatory Visit: Payer: Medicaid Other | Admitting: Physician Assistant

## 2017-08-27 ENCOUNTER — Encounter: Payer: Self-pay | Admitting: Physician Assistant

## 2017-08-27 VITALS — BP 144/80 | HR 86 | Temp 98.2°F | Wt 206.4 lb

## 2017-08-27 DIAGNOSIS — G8929 Other chronic pain: Secondary | ICD-10-CM

## 2017-08-27 DIAGNOSIS — R0601 Orthopnea: Secondary | ICD-10-CM

## 2017-08-27 DIAGNOSIS — Z125 Encounter for screening for malignant neoplasm of prostate: Secondary | ICD-10-CM

## 2017-08-27 DIAGNOSIS — I10 Essential (primary) hypertension: Secondary | ICD-10-CM

## 2017-08-27 DIAGNOSIS — F17219 Nicotine dependence, cigarettes, with unspecified nicotine-induced disorders: Secondary | ICD-10-CM

## 2017-08-27 DIAGNOSIS — J449 Chronic obstructive pulmonary disease, unspecified: Secondary | ICD-10-CM

## 2017-08-27 DIAGNOSIS — E785 Hyperlipidemia, unspecified: Secondary | ICD-10-CM

## 2017-08-27 MED ORDER — LISINOPRIL 20 MG PO TABS: 20.0000 mg | ORAL_TABLET | Freq: Every day | ORAL | 1 refills | Status: AC

## 2017-08-27 NOTE — Progress Notes (Signed)
BP (!) 144/80 (BP Location: Left Arm, Patient Position: Sitting, Cuff Size: Normal)   Pulse 86   Temp 98.2 F (36.8 C)   Wt 206 lb 6.4 oz (93.6 kg)   SpO2 97%   BMI 29.62 kg/m    Subjective:    Patient ID: Dustin Whitaker, male    DOB: 09/02/1961, 56 y.o.   MRN: [id number]  HPI: Dustin Whitaker is a 56 y.o. male presenting on 08/27/2017 for Hypertension   HPI  [facility] new permanent provider takes over mid-October  Pt was referred to cardiology for orthopnea. He has appointment with dr Diona BrownerMcDowell 09/12/17.  Most recent labs were done 03/12/17- these were reviewed.   Relevant past medical, surgical, family and social history reviewed and updated as indicated. Interim medical history since our last visit reviewed. Allergies and medications reviewed and updated.   Current Outpatient Prescriptions:  .  albuterol (PROVENTIL HFA;VENTOLIN HFA) 108 (90 Base) MCG/ACT inhaler, Inhale 2 puffs into the lungs every 6 (six) hours as needed for wheezing or shortness of breath (cough)., Disp: , Rfl:  .  aspirin 81 MG chewable tablet, Chew 81 mg by mouth daily., Disp: , Rfl:  .  budesonide-formoterol (SYMBICORT) 160-4.5 MCG/ACT inhaler, Inhale 2 puffs into the lungs 2 (two) times daily., Disp: , Rfl:  .  cetirizine (ZYRTEC) 10 MG tablet, Take 10 mg by mouth daily., Disp: , Rfl:  .  fluticasone (FLONASE) 50 MCG/ACT nasal spray, Place 1 spray into both nostrils daily., Disp: , Rfl:  .  gabapentin (NEURONTIN) 300 MG capsule, Take 300 mg by mouth. 1 tab daily for 1 day, 1 tab BID day 2, 1 tab TID day 3 and thereafter as directed, Disp: , Rfl:  .  ibuprofen (ADVIL,MOTRIN) 800 MG tablet, Take 800 mg by mouth 3 (three) times daily as needed for mild pain., Disp: , Rfl:  .  lisinopril (PRINIVIL,ZESTRIL) 10 MG tablet, Take 10 mg by mouth daily., Disp: , Rfl:  .  pravastatin (PRAVACHOL) 40 MG tablet, Take 40 mg by mouth daily., Disp: , Rfl:  .  Tiotropium  Bromide Monohydrate (SPIRIVA RESPIMAT) 2.5 MCG/ACT AERS, Inhale 2 puffs into the lungs daily., Disp: , Rfl:  .  HYDROcodone-acetaminophen (NORCO/VICODIN) 5-325 MG tablet, Take 1 tablet by mouth every 6 (six) hours as needed for severe pain. (Patient not taking: Reported on 08/27/2017), Disp: 10 tablet, Rfl: 0   Review of Systems  Constitutional: Positive for fatigue. Negative for appetite change, chills, diaphoresis, fever and unexpected weight change.  HENT: Negative for congestion, drooling, ear pain, facial swelling, hearing loss, mouth sores, sneezing, sore throat, trouble swallowing and voice change.   Eyes: Negative for pain, discharge, redness, itching and visual disturbance.  Respiratory: Negative for cough, choking, shortness of breath and wheezing.   Cardiovascular: Negative for chest pain, palpitations and leg swelling.  Gastrointestinal: Negative for abdominal pain, blood in stool, constipation, diarrhea and vomiting.  Endocrine: Negative for cold intolerance, heat intolerance and polydipsia.  Genitourinary: Negative for decreased urine volume, dysuria and hematuria.  Musculoskeletal: Positive for gait problem. Negative for arthralgias and back pain.  Skin: Negative for rash.  Allergic/Immunologic: Positive for environmental allergies.  Neurological: Negative for seizures, syncope, light-headedness and headaches.  Hematological: Negative for adenopathy.  Psychiatric/Behavioral: Negative for agitation, dysphoric mood and suicidal ideas. The patient is not nervous/anxious.     Per HPI unless specifically indicated above     Objective:  BP (!) 144/80 (BP Location: Left Arm, Patient Position: Sitting, Cuff Size: Normal)   Pulse 86   Temp 98.2 F (36.8 C)   Wt 206 lb 6.4 oz (93.6 kg)   SpO2 97%   BMI 29.62 kg/m   Wt Readings from Last 3 Encounters:  08/27/17 206 lb 6.4 oz (93.6 kg)  10/09/15 190 lb (86.2 kg)  09/30/15 185 lb (83.9 kg)    Physical Exam  Constitutional:  He is oriented to person, place, and time. He appears well-developed and well-nourished.  HENT:  Head: Normocephalic and atraumatic.  Neck: Neck supple.  Cardiovascular: Normal rate and regular rhythm.   Pulmonary/Chest: Effort normal and breath sounds normal. He has no wheezes.  Abdominal: Soft. Bowel sounds are normal. There is no hepatosplenomegaly. There is no tenderness.  Musculoskeletal: He exhibits no edema.  Lymphadenopathy:    He has no cervical adenopathy.  Neurological: He is alert and oriented to person, place, and time.  Skin: Skin is warm and dry.  Psychiatric: He has a normal mood and affect. His behavior is normal.  Vitals reviewed.       Assessment & Plan:   Encounter Diagnoses  Name Primary?  . Essential hypertension Yes  . Orthopnea   . Hyperlipidemia, unspecified hyperlipidemia type   . Screening for prostate cancer   . Other chronic pain   . Chronic obstructive pulmonary disease, unspecified COPD type (HCC)   . Cigarette nicotine dependence with nicotine-induced disorder     -will Increase lisinopril to  daily for better bp control -pt to Keep appointment with dr Diona Browner. -counseled pt on Smoking cessation. -will check fasting lipids, cmp, h/h, psA. We will call pt with results -pt to follow up in one month.  RTO sooner prn

## 2017-08-29 ENCOUNTER — Encounter: Payer: Self-pay | Admitting: Physician Assistant

## 2017-09-11 ENCOUNTER — Encounter: Payer: Self-pay | Admitting: Cardiology

## 2017-09-11 NOTE — Progress Notes (Signed)
Cardiology Office Note  Date: 09/12/2017   ID: Dustin Whitaker, DOB 1961-10-26, MRN [id number]  PCP: Jacquelin Hawking, PA-C  Consulting Cardiologist: Nona Dell, MD   Chief Complaint  Patient presents with  . Nocturnal dyspnea    History of Present Illness: Dustin Whitaker is a 56 y.o. male referred for cardiology consultation by Ms. McElroy PA-C for the assessment of reported orthopnea. I reviewed his records and updated the chart. He presents describing a long-term history of intermittent nocturnal dyspnea. He has been diagnosed with severe COPD in the setting of tobacco abuse. He has a nebulizer at home that he has used for the last 4-5 years, also MDIs. He states that he wakes up at night time short of breath, has to sit up because he feels panicky, walks around and he feels better. Nebulizer is also helpful when this happens. He has not had any overnight oximetry or previous sleep testing. During the daytime when he is up and moving around he does not report similar symptoms, generally has NYHA class II dyspnea, no exertional chest pain. He does have intermittent wheezing and uses MDIs. No progressive leg swelling.  He had a recent echocardiogram done at [facility] in July that showed normal LVEF with only mild diastolic dysfunction as outlined below. I reviewed his ECG from May. I also reviewed his recent lab work.  We went over his current medications. He states that he has been compliant. He tells me that his nebulizer is currently broken and he is trying to get a prescription from his PCP office to obtain a new one.  Past Medical History:  Diagnosis Date  . Chronic pain   . COPD (chronic obstructive pulmonary disease) (HCC)   . Essential hypertension   . Hepatitis C   . Lumbago with sciatica, left side   . Mixed hyperlipidemia     Past Surgical History:  Procedure Laterality Date  . Staph infection     groin 7-8 yrs ago Dr. Malvin Johns.     Current  Outpatient Prescriptions  Medication Sig Dispense Refill  . albuterol (PROVENTIL HFA;VENTOLIN HFA) 108 (90 Base) MCG/ACT inhaler Inhale 2 puffs into the lungs every 6 (six) hours as needed for wheezing or shortness of breath (cough).    Marland Kitchen aspirin 81 MG chewable tablet Chew 81 mg by mouth daily.    . budesonide-formoterol (SYMBICORT) 160-4.5 MCG/ACT inhaler Inhale 2 puffs into the lungs 2 (two) times daily.    . cetirizine (ZYRTEC) 10 MG tablet Take 10 mg by mouth daily.    . fluticasone (FLONASE) 50 MCG/ACT nasal spray Place 1 spray into both nostrils daily.    Marland Kitchen gabapentin (NEURONTIN) 300 MG capsule Take 300 mg by mouth. 1 tab daily for 1 day, 1 tab BID day 2, 1 tab TID day 3 and thereafter as directed    . ibuprofen (ADVIL,MOTRIN) 800 MG tablet Take 800 mg by mouth 3 (three) times daily as needed for mild pain.    Marland Kitchen lisinopril (PRINIVIL,ZESTRIL) 20 MG tablet Take 1 tablet (20 mg total) by mouth daily. 30 tablet 1  . pravastatin (PRAVACHOL) 40 MG tablet Take 40 mg by mouth daily.    . Tiotropium Bromide Monohydrate (SPIRIVA RESPIMAT) 2.5 MCG/ACT AERS Inhale 2 puffs into the lungs daily.     No current facility-administered medications for this visit.    Allergies:  Penicillins   Social History: The patient  reports that he has been smoking Cigarettes.  He has  a 40.00 pack-year smoking history. He has never used smokeless tobacco. He reports that he drinks alcohol. He reports that he does not use drugs.   Family History: The patient's family history includes Heart failure in his father; Lung cancer in his mother.   ROS:  Please see the history of present illness. Otherwise, complete review of systems is positive for chronic back pain.  All other systems are reviewed and negative.   Physical Exam: VS:  BP (!) 162/90   Pulse 60   Ht  (1.778 m)   Wt 212 lb (96.2 kg)   SpO2 92%   BMI 30.42 kg/m , BMI Body mass index is 30.42 kg/m.  Wt Readings from Last 3 Encounters:  09/12/17 212  lb (96.2 kg)  08/27/17 206 lb 6.4 oz (93.6 kg)  10/09/15 190 lb (86.2 kg)    General: Overweight male, appears comfortable at rest. HEENT: Conjunctiva and lids normal, oropharynx clear with poor dentition. Neck: Supple, no elevated JVP or carotid bruits, no thyromegaly. Lungs: Diminished breath sounds without active wheezing, nonlabored breathing at rest. Cardiac: Regular rate and rhythm, no S3 or significant systolic murmur, no pericardial rub. Abdomen: Soft, nontender, bowel sounds present, no guarding or rebound. Extremities: No pitting edema, distal pulses 2+. Skin: Warm and dry. Musculoskeletal: No kyphosis. Neuropsychiatric: Alert and oriented x3, affect grossly appropriate.  ECG: I personally reviewed the tracing from 05/08/2017 which showed sinus rhythm with nonspecific T-wave changes.  Recent Labwork:  September 2018: BUN 13, creatinine 0.8 to, AST 17, ALT 11, cholesterol 183, triglycerides 137, HDL 44, LDL 112, potassium 4.7, hemoglobin 14.13 February 2017: TSH 1.84, hemoglobin A1c 5.1  Other Studies Reviewed Today:  Echocardiogram 06/23/2017 [facility]): Mild LVH with LVEF 55-60% and grade 1 diastolic dysfunction, mild left atrial enlargement, normal right ventricular contraction, mildly thickened aortic leaflets without stenosis, trace mitral regurgitation, physiologic tricuspid regurgitation, unable to assess RVSP, mildly dilated ascending aorta measuring 4.2 cm.  Assessment and Plan:  1. History of nocturnal dyspnea. Based on description and his recent reassuring echocardiogram, this is likely more related to his lung disease. LVEF is normal at 55-60% and he has only mild diastolic dysfunction. Would suggest further pulmonary assessment, he should have overnight oximetry or more optimally a formal sleep study, may also want to consider a 6 minute walk test if not already done with his PFTs. He states that he has had a recent chest x-ray, I could not pull the  records. No additional cardiac testing is planned at this time. I have recommended that he contact his PCP office regarding prescription for new nebulizer, and then arrange follow-up to continue with management of his COPD.  2. Tobacco abuse, smoking cessation discussed  3. Essential hypertension, currently on lisinopril. Blood pressure elevated today. May need further medication up titration over time with PCP. Addition of a low-dose diuretic could be considered next particularly with mild diastolic dysfunction.  4. Hyperlipidemia, on Pravachol. Recent LDL 112.  Current medicines were reviewed with the patient today.  Disposition: Follow-up as needed.  Signed, Jonelle Sidle, MD, [facility] 09/12/2017 9:26 AM    [facility] at [facility] [address] [place], [place], [place] [address] Phone: [phone]; Fax: [phone]

## 2017-09-12 ENCOUNTER — Encounter: Payer: Self-pay | Admitting: Cardiology

## 2017-09-12 ENCOUNTER — Ambulatory Visit (INDEPENDENT_AMBULATORY_CARE_PROVIDER_SITE_OTHER): Payer: Medicaid Other | Admitting: Cardiology

## 2017-09-12 VITALS — BP 162/90 | HR 60 | Ht 70.0 in | Wt 212.0 lb

## 2017-09-12 DIAGNOSIS — Z720 Tobacco use: Secondary | ICD-10-CM

## 2017-09-12 DIAGNOSIS — J449 Chronic obstructive pulmonary disease, unspecified: Secondary | ICD-10-CM | POA: Diagnosis not present

## 2017-09-12 DIAGNOSIS — R0600 Dyspnea, unspecified: Secondary | ICD-10-CM

## 2017-09-12 DIAGNOSIS — I10 Essential (primary) hypertension: Secondary | ICD-10-CM | POA: Diagnosis not present

## 2017-09-12 DIAGNOSIS — E782 Mixed hyperlipidemia: Secondary | ICD-10-CM

## 2017-09-12 NOTE — Patient Instructions (Signed)
Medication Instructions:  Continue all current medications.  Labwork: none  Testing/Procedures: none  Follow-Up: As needed.    Any Other Special Instructions Will Be Listed Below (If Applicable).  If you need a refill on your cardiac medications before your next appointment, please call your pharmacy.  

## 2017-10-01 ENCOUNTER — Encounter: Payer: Self-pay | Admitting: Physician Assistant

## 2017-10-01 ENCOUNTER — Other Ambulatory Visit: Payer: Self-pay | Admitting: Physician Assistant

## 2017-10-01 ENCOUNTER — Ambulatory Visit: Payer: Medicaid Other | Admitting: Physician Assistant

## 2017-10-01 VITALS — BP 132/70 | HR 91 | Temp 97.5°F | Wt 212.8 lb

## 2017-10-01 DIAGNOSIS — E785 Hyperlipidemia, unspecified: Secondary | ICD-10-CM

## 2017-10-01 DIAGNOSIS — J449 Chronic obstructive pulmonary disease, unspecified: Secondary | ICD-10-CM

## 2017-10-01 DIAGNOSIS — I10 Essential (primary) hypertension: Secondary | ICD-10-CM

## 2017-10-01 DIAGNOSIS — F17219 Nicotine dependence, cigarettes, with unspecified nicotine-induced disorders: Secondary | ICD-10-CM

## 2017-10-01 MED ORDER — IPRATROPIUM-ALBUTEROL 0.5-2.5 (3) MG/3ML IN SOLN: 3.0000 mL | Freq: Four times a day (QID) | RESPIRATORY_TRACT | 1 refills | Status: AC | PRN

## 2017-10-01 NOTE — Progress Notes (Signed)
BP 132/70 (BP Location: Left Arm, Patient Position: Sitting, Cuff Size: Normal)   Pulse 91   Temp (!) 97.5 F (36.4 C)   Wt 212 lb 12.8 oz (96.5 kg)   SpO2 97%   BMI 30.53 kg/m    Subjective:    Patient ID: Dustin Whitaker, male    DOB: 1961-04-11, 56 y.o.   MRN: [id number]  HPI: Dustin Whitaker is a 56 y.o. male presenting on 10/01/2017 for Follow-up   HPI   I am seeing pt at [facility] clinic as interim provider until new permanent provider takes over mid-October  Pt seen by dr Diona Browner recently.  Nocturnal  Dyspnea was felt to be due to lung disease and not cardiac issues.   Pt says his nebulizer broke recently and requests rx for a new one.   He uses his rescue inhaler every day.  He was using his nebulizer once/week before it broke.    Pt is still smoking.   Pt states that his orthopedic issues limit his walking more than does his breathing.  Relevant past medical, surgical, family and social history reviewed and updated as indicated. Interim medical history since our last visit reviewed. Allergies and medications reviewed and updated.   Current Outpatient Prescriptions:  .  albuterol (PROVENTIL HFA;VENTOLIN HFA) 108 (90 Base) MCG/ACT inhaler, Inhale 2 puffs into the lungs every 6 (six) hours as needed for wheezing or shortness of breath (cough)., Disp: , Rfl:  .  aspirin 81 MG chewable tablet, Chew 81 mg by mouth daily., Disp: , Rfl:  .  budesonide-formoterol (SYMBICORT) 160-4.5 MCG/ACT inhaler, Inhale 2 puffs into the lungs 2 (two) times daily., Disp: , Rfl:  .  cetirizine (ZYRTEC) 10 MG tablet, Take 10 mg by mouth daily., Disp: , Rfl:  .  fluticasone (FLONASE) 50 MCG/ACT nasal spray, Place 1 spray into both nostrils daily., Disp: , Rfl:  .  gabapentin (NEURONTIN) 300 MG capsule, Take 300 mg by mouth. 1 tab daily for 1 day, 1 tab BID day 2, 1 tab TID day 3 and thereafter as directed, Disp: , Rfl:  .  ibuprofen (ADVIL,MOTRIN) 800 MG tablet, Take 800 mg by mouth 3  (three) times daily as needed for mild pain., Disp: , Rfl:  .  lisinopril (PRINIVIL,ZESTRIL) 20 MG tablet, Take 1 tablet (20 mg total) by mouth daily., Disp: 30 tablet, Rfl: 1 .  pravastatin (PRAVACHOL) 40 MG tablet, Take 40 mg by mouth daily., Disp: , Rfl:  .  Tiotropium Bromide Monohydrate (SPIRIVA RESPIMAT) 2.5 MCG/ACT AERS, Inhale 2 puffs into the lungs daily., Disp: , Rfl:   Review of Systems  Constitutional: Negative for appetite change, chills, diaphoresis, fatigue, fever and unexpected weight change.  HENT: Negative for congestion, dental problem, drooling, ear pain, facial swelling, hearing loss, mouth sores, sneezing, sore throat, trouble swallowing and voice change.   Eyes: Negative for pain, discharge, redness, itching and visual disturbance.  Respiratory: Negative for cough, choking, shortness of breath and wheezing.   Cardiovascular: Negative for chest pain, palpitations and leg swelling.  Gastrointestinal: Negative for abdominal pain, blood in stool, constipation, diarrhea and vomiting.  Endocrine: Negative for cold intolerance, heat intolerance and polydipsia.  Genitourinary: Negative for decreased urine volume, dysuria and hematuria.  Musculoskeletal: Positive for arthralgias and back pain. Negative for gait problem.  Skin: Negative for rash.  Allergic/Immunologic: Positive for environmental allergies.  Neurological: Negative for seizures, syncope, light-headedness and headaches.  Hematological: Negative for adenopathy.  Psychiatric/Behavioral: Negative for agitation, dysphoric  mood and suicidal ideas. The patient is not nervous/anxious.     Per HPI unless specifically indicated above     Objective:    BP 132/70 (BP Location: Left Arm, Patient Position: Sitting, Cuff Size: Normal)   Pulse 91   Temp (!) 97.5 F (36.4 C)   Wt 212 lb 12.8 oz (96.5 kg)   SpO2 97%   BMI 30.53 kg/m   Wt Readings from Last 3 Encounters:  10/01/17 212 lb 12.8 oz (96.5 kg)  09/12/17 212  lb (96.2 kg)  08/27/17 206 lb 6.4 oz (93.6 kg)    Physical Exam  Constitutional: He is oriented to person, place, and time. He appears well-developed and well-nourished.  HENT:  Head: Normocephalic and atraumatic.  Neck: Neck supple.  Cardiovascular: Normal rate and regular rhythm.   Pulmonary/Chest: Effort normal and breath sounds normal. He has no wheezes.  Abdominal: Soft. Bowel sounds are normal. There is no hepatosplenomegaly. There is no tenderness.  Musculoskeletal: He exhibits no edema.  Lymphadenopathy:    He has no cervical adenopathy.  Neurological: He is alert and oriented to person, place, and time.  Skin: Skin is warm and dry.  Psychiatric: He has a normal mood and affect. His behavior is normal.  Vitals reviewed.       Assessment & Plan:   Encounter Diagnoses  Name Primary?  . Essential hypertension Yes  . Chronic obstructive pulmonary disease, unspecified COPD type (HCC)   . Cigarette nicotine dependence with nicotine-induced disorder   . Hyperlipidemia, unspecified hyperlipidemia type     -reviewed recent lab results -PSA was ordered with recent labs but was not done- it should be included next time he has labs drawn.  -rx nebulizer  Machine and duoneb -counseled pt on smoking cessation -discussed with pt that we are glad to do what we can to treat his copd and improve his breathing but results are limited by his continuing to smoke -Continue current bp medications -pt to follow up in 2 months to recheck bp, copd
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
## 2017-10-23 IMAGING — CR DG CHEST 2V
2 series · 2 of 2 positions shown · non-contrast
Comparison: CT chest 09/30/2015

CLINICAL DATA: Right-sided chest pain.  Right rib fractures.

EXAM:
CHEST  2 VIEW

[chest pa]
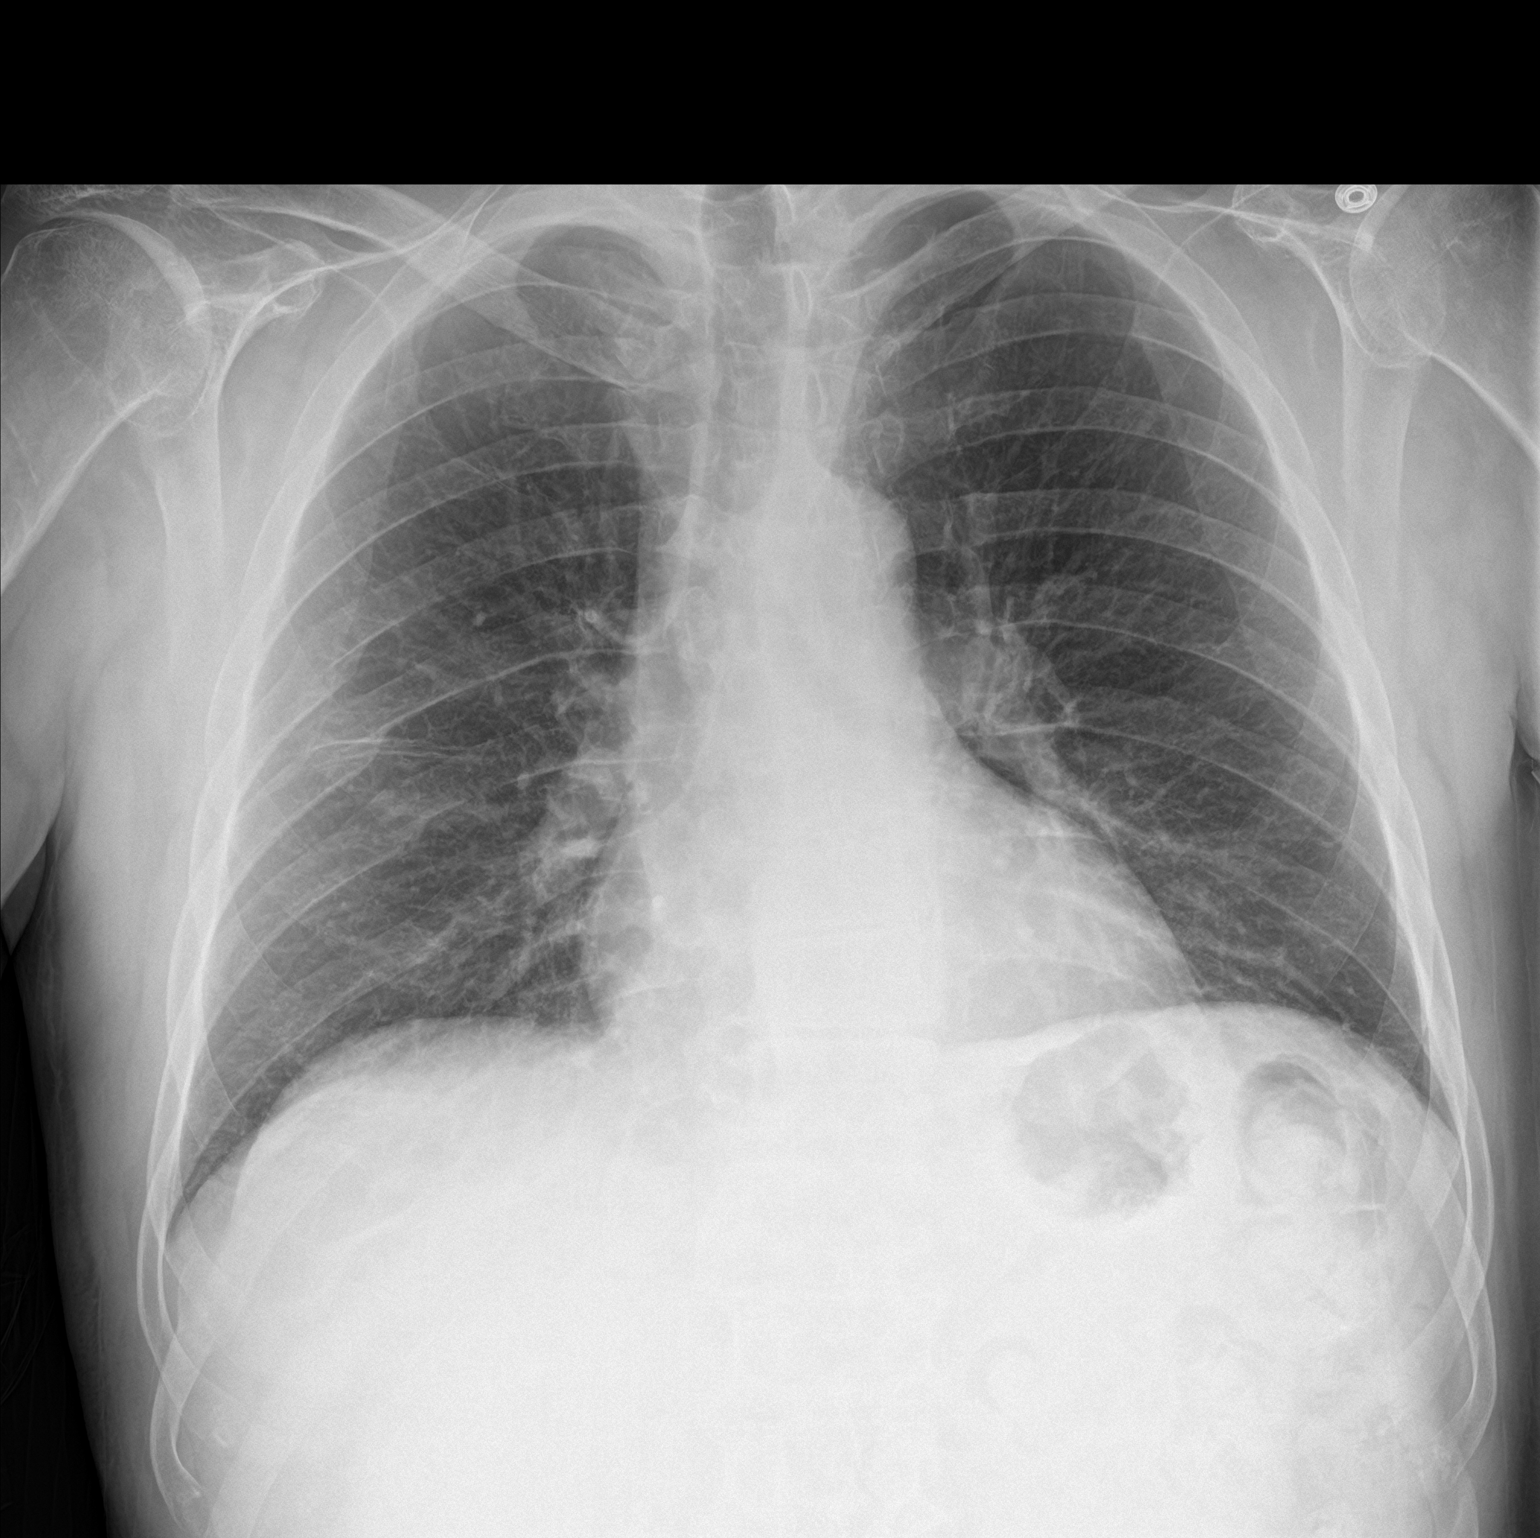

[chest lat]
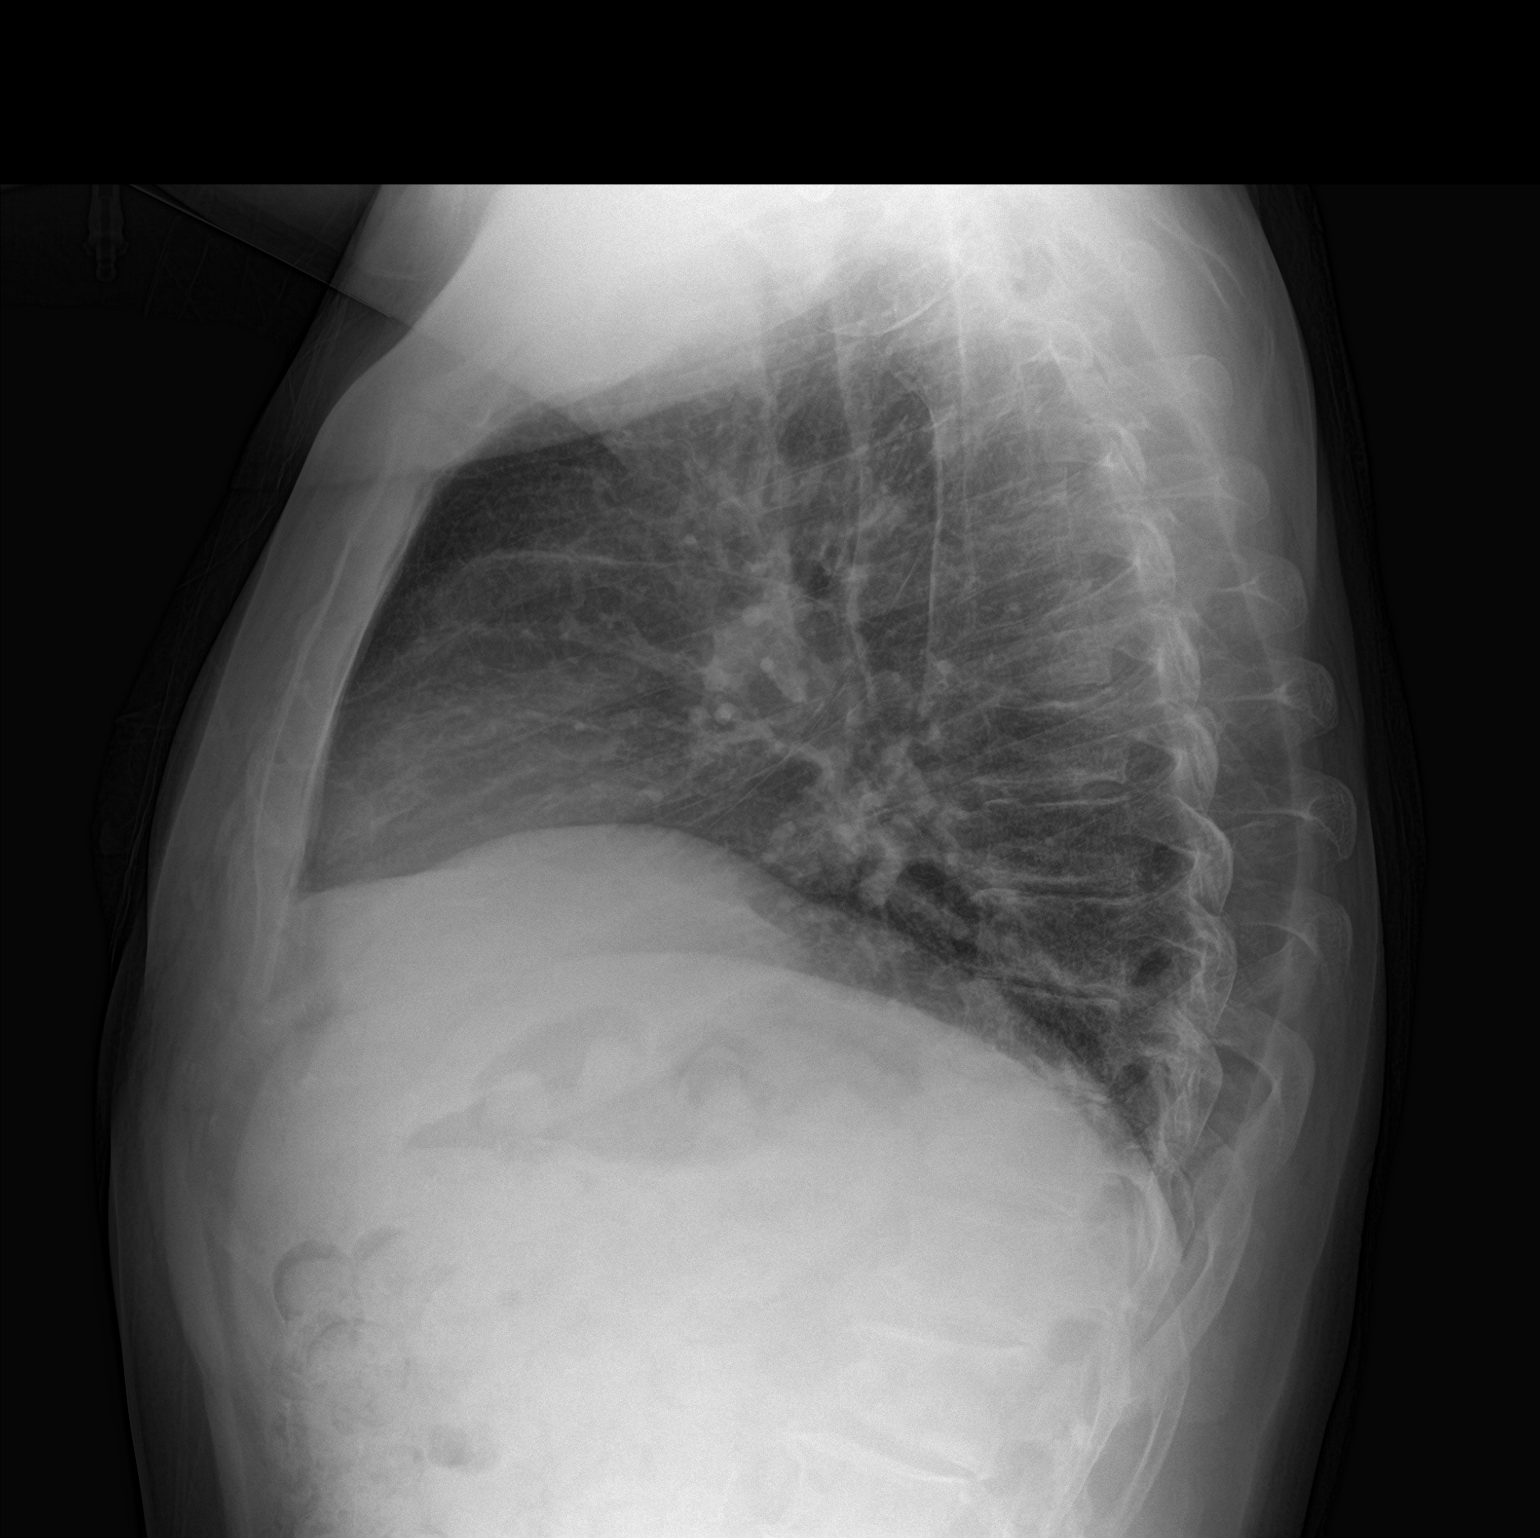

[2 of 2 positions shown; findings below may reference images not displayed]

FINDINGS: There is no focal parenchymal opacity. There is no pleural effusion
or pneumothorax. The heart and mediastinal contours are
unremarkable.

There is a mildly displaced medial right clavicular fracture. There
are multiple healing right posterior rib fractures involving the
sixth, seventh and eighth ribs.
IMPRESSION: 1. No acute cardiopulmonary disease.
2. Mildly displaced, medial right clavicular fracture.
3. Multiple healing right posterior rib fractures.

## 2020-05-13 ENCOUNTER — Telehealth: Payer: Self-pay | Admitting: Physician Assistant

## 2020-05-13 NOTE — Telephone Encounter (Signed)
I connected by phone with Dustin Whitaker and/or patient's caregiver on 05/13/2020 at 1:50 PM to discuss the potential vaccination through our Homebound vaccination initiative.   Prevaccination Checklist for COVID-19 Vaccines  1.  Are you feeling sick today? no  2.  Have you ever received a dose of a COVID-19 vaccine?  no      If yes, which one? None   3.  Have you ever had an allergic reaction: (This would include a severe reaction [ e.g., anaphylaxis] that required treatment with epinephrine or EpiPen or that caused you to go to the hospital.  It would also include an allergic reaction that occurred within 4 hours that caused hives, swelling, or respiratory distress, including wheezing.) A.  A previous dose of COVID-19 vaccine. no  B.  A vaccine or injectable therapy that contains multiple components, one of which is a COVID-19 vaccine component, but it is not known which component elicited the immediate reaction. no  C.  Are you allergic to polyethylene glycol? no   4.  Have you ever had an allergic reaction to another vaccine (other than COVID-19 vaccine) or an injectable medication? (This would include a severe reaction [ e.g., anaphylaxis] that required treatment with epinephrine or EpiPen or that caused you to go to the hospital.  It would also include an allergic reaction that occurred within 4 hours that caused hives, swelling, or respiratory distress, including wheezing.)  no   5.  Have you ever had a severe allergic reaction (e.g., anaphylaxis) to something other than a component of the COVID-19 vaccine, or any vaccine or injectable medication?  This would include food, pet, venom, environmental, or oral medication allergies.  no   6.  Have you received any vaccine in the last 14 days? no   7.  Have you ever had a positive test for COVID-19 or has a doctor ever told you that you had COVID-19?  no   8.  Have you received passive antibody therapy (monoclonal antibodies or convalescent  serum) as a treatment for COVID-19? no   9.  Do you have a weakened immune system caused by something such as HIV infection or cancer or do you take immunosuppressive drugs or therapies?  no   10.  Do you have a bleeding disorder or are you taking a blood thinner? no   11.  Are you pregnant or breast-feeding? no   12.  Do you have dermal fillers? no    This patient is a 59 y.o. male that meets the FDA criteria to receive homebound vaccination. Patient or parent/caregiver understands they have the option to accept or refuse homebound vaccination.  Patient passed the pre-screening checklist and would like to proceed with homebound vaccination.  Based on questionnaire above, I recommend the patient be observed for 30 minutes.  There are an estimated # 0 of other household members/caregivers who are also interested in receiving the vaccine.     I will send the patient's information to our scheduling team who will reach out to schedule the patient and potential caregiver/family members for homebound vaccination.   He is allergic to penicillin in childhood.  Unknown reaction.  Patient prefers [employer] vaccine.   Dustin Whitaker 05/13/2020 1:50 PM
